# Patient Record
Sex: Female | Born: 1970 | Race: Black or African American | Hispanic: No | Marital: Married | Smoking: Never smoker
Health system: Southern US, Community
[De-identification: ages and names within clinical notes are randomized; demographics above are authoritative.]

## PROBLEM LIST (undated history)

## (undated) DIAGNOSIS — E119 Type 2 diabetes mellitus without complications: Secondary | ICD-10-CM

## (undated) DIAGNOSIS — I10 Essential (primary) hypertension: Secondary | ICD-10-CM

## (undated) DIAGNOSIS — E7800 Pure hypercholesterolemia, unspecified: Secondary | ICD-10-CM

## (undated) HISTORY — PX: TUBAL LIGATION: SHX77

## (undated) HISTORY — DX: Essential (primary) hypertension: I10

## (undated) HISTORY — DX: Type 2 diabetes mellitus without complications: E11.9

## (undated) HISTORY — DX: Pure hypercholesterolemia, unspecified: E78.00

## (undated) HISTORY — PX: GALLBLADDER SURGERY: SHX652

---

## 2007-01-04 HISTORY — PX: REDUCTION MAMMAPLASTY: SUR839

## 2014-08-12 ENCOUNTER — Other Ambulatory Visit: Payer: Self-pay | Admitting: Internal Medicine

## 2014-08-12 DIAGNOSIS — E7800 Pure hypercholesterolemia, unspecified: Secondary | ICD-10-CM | POA: Insufficient documentation

## 2014-08-12 DIAGNOSIS — Z1231 Encounter for screening mammogram for malignant neoplasm of breast: Secondary | ICD-10-CM

## 2014-08-12 DIAGNOSIS — Z6841 Body Mass Index (BMI) 40.0 and over, adult: Secondary | ICD-10-CM | POA: Insufficient documentation

## 2014-08-12 DIAGNOSIS — I10 Essential (primary) hypertension: Secondary | ICD-10-CM | POA: Insufficient documentation

## 2014-08-12 DIAGNOSIS — E119 Type 2 diabetes mellitus without complications: Secondary | ICD-10-CM | POA: Insufficient documentation

## 2014-08-19 ENCOUNTER — Ambulatory Visit: Payer: Self-pay

## 2015-03-13 ENCOUNTER — Ambulatory Visit
Admission: RE | Admit: 2015-03-13 | Discharge: 2015-03-13 | Disposition: A | Discharge status: Home / Self Care | Payer: Managed Care, Other (non HMO) | Source: Ambulatory Visit | Attending: Internal Medicine | Admitting: Internal Medicine

## 2015-03-13 DIAGNOSIS — Z1231 Encounter for screening mammogram for malignant neoplasm of breast: Secondary | ICD-10-CM | POA: Diagnosis not present

## 2015-08-21 DIAGNOSIS — D649 Anemia, unspecified: Secondary | ICD-10-CM | POA: Insufficient documentation

## 2016-04-19 ENCOUNTER — Other Ambulatory Visit: Payer: Self-pay | Admitting: Internal Medicine

## 2016-04-19 DIAGNOSIS — Z1231 Encounter for screening mammogram for malignant neoplasm of breast: Secondary | ICD-10-CM

## 2016-05-10 ENCOUNTER — Ambulatory Visit
Admission: RE | Admit: 2016-05-10 | Discharge: 2016-05-10 | Disposition: A | Discharge status: Home / Self Care | Payer: Managed Care, Other (non HMO) | Source: Ambulatory Visit | Attending: Internal Medicine | Admitting: Internal Medicine

## 2016-05-10 ENCOUNTER — Encounter (HOSPITAL_COMMUNITY): Payer: Self-pay

## 2016-05-10 DIAGNOSIS — Z1231 Encounter for screening mammogram for malignant neoplasm of breast: Secondary | ICD-10-CM | POA: Diagnosis not present

## 2017-02-16 ENCOUNTER — Ambulatory Visit: Payer: Self-pay | Admitting: Advanced Practice Midwife

## 2017-03-03 ENCOUNTER — Ambulatory Visit (INDEPENDENT_AMBULATORY_CARE_PROVIDER_SITE_OTHER): Payer: Managed Care, Other (non HMO) | Admitting: Advanced Practice Midwife

## 2017-03-03 ENCOUNTER — Encounter: Payer: Self-pay | Admitting: Advanced Practice Midwife

## 2017-03-03 VITALS — BP 140/98 | HR 90 | Ht 60.0 in | Wt 240.0 lb

## 2017-03-03 DIAGNOSIS — Z124 Encounter for screening for malignant neoplasm of cervix: Secondary | ICD-10-CM | POA: Diagnosis not present

## 2017-03-03 DIAGNOSIS — Z1211 Encounter for screening for malignant neoplasm of colon: Secondary | ICD-10-CM | POA: Diagnosis not present

## 2017-03-03 DIAGNOSIS — Z01419 Encounter for gynecological examination (general) (routine) without abnormal findings: Secondary | ICD-10-CM

## 2017-03-03 NOTE — Progress Notes (Signed)
Patient ID: Kimberly Branch, female   DOB: 1970-06-22, 47 y.o.   MRN: [id number]    Gynecology Annual Exam  PCP: Barbette Reichmann, MD  Chief Complaint:  Chief Complaint  Patient presents with  . Gynecologic Exam    History of Present Illness: Patient is a 47 y.o. G3P3003 presents for annual exam. The patient has no complaints today. She is having some irregular periods. Her last period was in September of 2018. Prior to that she had slightly irregular periods ranging from 2 weeks to 2 months. They usually last 5 days and are of medium flow. She denies clots or heavy bleeding.   LMP: Patient's last menstrual period was 09/21/2016. Postcoital Bleeding: no Dysmenorrhea: no   The patient is sexually active. She currently uses tubal ligation for contraception. She has mild dyspareunia- mainly dryness. She uses lubrication which helps.  The patient does perform self breast exams.  There is no notable family history of breast or ovarian cancer in her family.  The patient wears seatbelts: yes.   The patient has regular exercise: yes.  She uses the treadmill about 2 days per week.  The patient denies current symptoms of depression.    Review of Systems: Review of Systems  Constitutional: Negative.   HENT: Negative.   Eyes: Negative.   Respiratory: Negative.   Cardiovascular: Negative.   Gastrointestinal: Negative.   Genitourinary: Negative.   Musculoskeletal: Negative.   Skin: Negative.   Neurological: Negative.   Endo/Heme/Allergies: Negative.   Psychiatric/Behavioral: Negative.     Past Medical History:  Past Medical History:  Diagnosis Date  . Diabetes mellitus without complication (HCC)   . Hypercholesterolemia   . Hypertension     Past Surgical History:  Past Surgical History:  Procedure Laterality Date  . GALLBLADDER SURGERY    . REDUCTION MAMMAPLASTY Bilateral 2009  . TUBAL LIGATION      Gynecologic History:  Patient's last menstrual period was  09/21/2016. Contraception: tubal ligation Last Pap: 4 years ago Results were:  no abnormalities  Last mammogram: 2018 Results were: BI-RAD I  Obstetric History: [id number]  Family History:  Family History  Problem Relation Age of Onset  . Hypertension Mother   . Diabetes Father   . Hypertension Father   . Breast cancer Neg Hx     Social History:  Social History   Socioeconomic History  . Marital status: Married    Spouse name: Not on file  . Number of children: Not on file  . Years of education: Not on file  . Highest education level: Not on file  Social Needs  . Financial resource strain: Not on file  . Food insecurity - worry: Not on file  . Food insecurity - inability: Not on file  . Transportation needs - medical: Not on file  . Transportation needs - non-medical: Not on file  Occupational History  . Not on file  Tobacco Use  . Smoking status: Never Smoker  . Smokeless tobacco: Never Used  Substance and Sexual Activity  . Alcohol use: Yes    Frequency: Never  . Drug use: No  . Sexual activity: Yes    Birth control/protection: None, Surgical  Other Topics Concern  . Not on file  Social History Narrative  . Not on file    Allergies:  No Known Allergies  Medications: Prior to Admission medications   Medication Sig Start Date End Date Taking? Authorizing Provider  albuterol (PROVENTIL HFA) 108 (90 Base) MCG/ACT inhaler Inhale into the lungs. 06/08/16  Yes [provider]  atorvastatin (LIPITOR) 10 MG tablet Take by mouth. 01/13/17  Yes [provider]  clonazePAM (KLONOPIN) 1 MG tablet Take by mouth. 01/13/17  Yes [provider]  cyanocobalamin (,VITAMIN B-12,) 1000 MCG/ML injection INJECT 1 ML INTO THE MUSCLE MONTHLY 09/06/16  Yes [provider]  furosemide (LASIX) 20 MG tablet Take by mouth. 01/13/17 01/13/18 Yes [provider]  glipiZIDE (GLUCOTROL) 5 MG tablet Take by mouth. 01/13/17  Yes [provider]   ibuprofen (ADVIL,MOTRIN) 800 MG tablet Take by mouth. 06/08/16  Yes [provider]  metFORMIN (GLUCOPHAGE) 1000 MG tablet Take by mouth. 01/13/17  Yes [provider]  metoprolol succinate (TOPROL-XL) 25 MG 24 hr tablet Take by mouth. 01/13/17 01/13/18 Yes [provider]  VENTOLIN HFA 108 (90 Base) MCG/ACT inhaler  01/01/17   [provider]    Physical Exam Vitals: Blood pressure (!) 140/98, pulse 90, height 5' (1.524 m), weight 240 lb (108.9 kg), last menstrual period 09/21/2016.  General: NAD HEENT: normocephalic, anicteric Thyroid: no enlargement, no palpable nodules Pulmonary: No increased work of breathing, CTAB Cardiovascular: RRR, distal pulses 2+ Breast: Breast symmetrical, no tenderness, no palpable nodules or masses, no skin or nipple retraction present, no nipple discharge.  No axillary or supraclavicular lymphadenopathy. Abdomen: NABS, soft, non-tender, non-distended.  Umbilicus without lesions.  No hepatomegaly, splenomegaly or masses palpable. No evidence of hernia  Genitourinary:  External: Normal external female genitalia.  Normal urethral meatus, normal  Bartholin's and Skene's glands.    Vagina: Normal vaginal mucosa, no evidence of prolapse.    Cervix: Grossly normal in appearance, no bleeding, no CMT  Uterus: Non-enlarged, mobile, normal contour.    Adnexa: ovaries non-enlarged, no adnexal masses  Rectal: deferred  Lymphatic: no evidence of inguinal lymphadenopathy Extremities: no edema, erythema, or tenderness Neurologic: Grossly intact Psychiatric: mood appropriate, affect full   Assessment: 47 y.o. G3P3003 routine annual exam  Plan: Problem List Items Addressed This Visit    None    Visit Diagnoses    Well woman exam with routine gynecological exam    -  Primary   Relevant Orders   IGP, Aptima HPV   Routine cervical smear       Relevant Orders   IGP, Aptima HPV   Screen for colon cancer       Relevant Orders    Ambulatory referral to Gastroenterology      1) Mammogram - recommend yearly screening mammogram.  Mammogram Is up to date   2) STI screening  was offered and declined  3) ASCCP guidelines and rational discussed.  Patient opts for every 3 years screening interval  4) Contraception - the patient is currently using  tubal ligation.   5) Colonoscopy: referral sent today-- Screening recommended starting at age 5 for average risk individuals, age 68 for individuals deemed at increased risk (including African Americans) and recommended to continue until age 81.  For patient age 25-85 individualized approach is recommended.  Gold standard screening is via colonoscopy, Cologuard screening is an acceptable alternative for patient unwilling or unable to undergo colonoscopy.  "Colorectal cancer screening for average?risk adults: 2018 guideline update from the American Cancer Society"[place]: A Cancer Journal for Clinicians: Jun 01, 2016   6) Routine healthcare maintenance including cholesterol, diabetes screening discussed managed by PCP   7) Increase healthy lifestyle diet and exercise  8) Return in 1 year (on 03/04/2018) for annual established gyn.   Tresea Mall, CNM [facility], [facility] 03/03/2017, 8:40 AM

## 2017-03-03 NOTE — Patient Instructions (Signed)
American Heart Association (AHA) Exercise Recommendation  Being physically active is important to prevent heart disease and stroke, the nation's No. 1and No. 5killers. To improve overall cardiovascular health, we suggest at least 150 minutes per week of moderate exercise or 75 minutes per week of vigorous exercise (or a combination of moderate and vigorous activity). Thirty minutes a day, five times a week is an easy goal to remember. You will also experience benefits even if you divide your time into two or three segments of 10 to 15 minutes per day.  For people who would benefit from lowering their blood pressure or cholesterol, we recommend 40 minutes of aerobic exercise of moderate to vigorous intensity three to four times a week to lower the risk for heart attack and stroke.  Physical activity is anything that makes you move your body and burn calories.  This includes things like climbing stairs or playing sports. Aerobic exercises benefit your heart, and include walking, jogging, swimming or biking. Strength and stretching exercises are best for overall stamina and flexibility.  The simplest, positive change you can make to effectively improve your heart health is to start walking. It's enjoyable, free, easy, social and great exercise. A walking program is flexible and boasts high success rates because people can stick with it. It's easy for walking to become a regular and satisfying part of life.   For Overall Cardiovascular Health:  At least 30 minutes of moderate-intensity aerobic activity at least 5 days per week for a total of 150  OR   At least 25 minutes of vigorous aerobic activity at least 3 days per week for a total of 75 minutes; or a combination of moderate- and vigorous-intensity aerobic activity  AND   Moderate- to high-intensity muscle-strengthening activity at least 2 days per week for additional health benefits.  For Lowering Blood Pressure and Cholesterol  An  average 40 minutes of moderate- to vigorous-intensity aerobic activity 3 or 4 times per week  What if I can't make it to the time goal? Something is always better than nothing! And everyone has to start somewhere. Even if you've been sedentary for years, today is the day you can begin to make healthy changes in your life. If you don't think you'll make it for 30 or 40 minutes, set a reachable goal for today. You can work up toward your overall goal by increasing your time as you get stronger. Don't let all-or-nothing thinking rob you of doing what you can every day.  Source:http://www.heart.org   Mediterranean Diet A Mediterranean diet refers to food and lifestyle choices that are based on the traditions of countries located on the [employer]. This way of eating has been shown to help prevent certain conditions and improve outcomes for people who have chronic diseases, like kidney disease and heart disease. What are tips for following this plan? Lifestyle  Cook and eat meals together with your family, when possible.  Drink enough fluid to keep your urine clear or pale yellow.  Be physically active every day. This includes: ? Aerobic exercise like running or swimming. ? Leisure activities like gardening, walking, or housework.  Get 7-8 hours of sleep each night.  If recommended by your health care provider, drink red wine in moderation. This means 1 glass a day for nonpregnant women and 2 glasses a day for men. A glass of wine equals 5 oz (150 mL). Reading food labels  Check the serving size of packaged foods. For foods such as rice  and pasta, the serving size refers to the amount of cooked product, not dry.  Check the total fat in packaged foods. Avoid foods that have saturated fat or trans fats.  Check the ingredients list for added sugars, such as corn syrup. Shopping  At the grocery store, buy most of your food from the areas near the walls of the store. This  includes: ? Fresh fruits and vegetables (produce). ? Grains, beans, nuts, and seeds. Some of these may be available in unpackaged forms or large amounts (in bulk). ? Fresh seafood. ? Poultry and eggs. ? Low-fat dairy products.  Buy whole ingredients instead of prepackaged foods.  Buy fresh fruits and vegetables in-season from local farmers markets.  Buy frozen fruits and vegetables in resealable bags.  If you do not have access to quality fresh seafood, buy precooked frozen shrimp or canned fish, such as tuna, salmon, or sardines.  Buy small amounts of raw or cooked vegetables, salads, or olives from the deli or salad bar at your store.  Stock your pantry so you always have certain foods on hand, such as olive oil, canned tuna, canned tomatoes, rice, pasta, and beans. Cooking  Cook foods with extra-virgin olive oil instead of using butter or other vegetable oils.  Have meat as a side dish, and have vegetables or grains as your main dish. This means having meat in small portions or adding small amounts of meat to foods like pasta or stew.  Use beans or vegetables instead of meat in common dishes like chili or lasagna.  Experiment with different cooking methods. Try roasting or broiling vegetables instead of steaming or sauteing them.  Add frozen vegetables to soups, stews, pasta, or rice.  Add nuts or seeds for added healthy fat at each meal. You can add these to yogurt, salads, or vegetable dishes.  Marinate fish or vegetables using olive oil, lemon juice, garlic, and fresh herbs. Meal planning  Plan to eat 1 vegetarian meal one day each week. Try to work up to 2 vegetarian meals, if possible.  Eat seafood 2 or more times a week.  Have healthy snacks readily available, such as: ? Vegetable sticks with hummus. ? [place] yogurt. ? Fruit and nut trail mix.  Eat balanced meals throughout the week. This includes: ? Fruit: 2-3 servings a day ? Vegetables: 4-5 servings a  day ? Low-fat dairy: 2 servings a day ? Fish, poultry, or lean meat: 1 serving a day ? Beans and legumes: 2 or more servings a week ? Nuts and seeds: 1-2 servings a day ? Whole grains: 6-8 servings a day ? Extra-virgin olive oil: 3-4 servings a day  Limit red meat and sweets to only a few servings a month What are my food choices?  Mediterranean diet ? Recommended ? Grains: Whole-grain pasta. Brown rice. Bulgar wheat. Polenta. Couscous. Whole-wheat bread. [place]. ? Vegetables: Artichokes. Beets. Broccoli. Cabbage. Carrots. Eggplant. Green beans. Chard. Kale. Spinach. Onions. Leeks. Peas. Squash. Tomatoes. Peppers. Radishes. ? Fruits: Apples. Apricots. Avocado. Berries. Bananas. Cherries. Dates. Figs. Grapes. Lemons. Melon. Oranges. Peaches. Plums. Pomegranate. ? Meats and other protein foods: Beans. Almonds. Sunflower seeds. Pine nuts. Peanuts. [place]. Salmon. Scallops. Shrimp. [place]. Tilapia. Clams. Oysters. Eggs. ? Dairy: Low-fat milk. Cheese. Greek yogurt. ? Beverages: Water. Red wine. Herbal tea. ? Fats and oils: Extra virgin olive oil. Avocado oil. Grape seed oil. ? Sweets and desserts: [place] yogurt with honey. Baked apples. Poached pears. Trail mix. ? Seasoning and other foods: Basil. Cilantro. Coriander.  Cumin. Mint. Parsley. Sage. Rosemary. Tarragon. Garlic. Oregano. Thyme. Pepper. Balsalmic vinegar. Tahini. Hummus. Tomato sauce. Olives. Mushrooms. ? Limit these ? Grains: Prepackaged pasta or rice dishes. Prepackaged cereal with added sugar. ? Vegetables: Deep fried potatoes (french fries). ? Fruits: Fruit canned in syrup. ? Meats and other protein foods: Beef. Pork. Lamb. Poultry with skin. Hot dogs. Bacon. ? Dairy: Ice cream. Sour cream. Whole milk. ? Beverages: Juice. Sugar-sweetened soft drinks. Beer. Liquor and spirits. ? Fats and oils: Butter. Canola oil. Vegetable oil. Beef fat (tallow). Lard. ? Sweets and desserts: Cookies. Cakes. Pies. Candy. ? Seasoning and other  foods: Mayonnaise. Premade sauces and marinades. ? The items listed may not be a complete list. Talk with your dietitian about what dietary choices are right for you. Summary  The Mediterranean diet includes both food and lifestyle choices.  Eat a variety of fresh fruits and vegetables, beans, nuts, seeds, and whole grains.  Limit the amount of red meat and sweets that you eat.  Talk with your health care provider about whether it is safe for you to drink red wine in moderation. This means 1 glass a day for nonpregnant women and 2 glasses a day for men. A glass of wine equals 5 oz (150 mL). This information is not intended to replace advice given to you by your health care provider. Make sure you discuss any questions you have with your health care provider. Document Released: 08/13/2015 Document Revised: 09/15/2015 Document Reviewed: 08/13/2015 Elsevier Interactive Patient Education  2018 [employer] Health Maintenance for Postmenopausal Women Menopause is a normal process in which your reproductive ability comes to an end. This process happens gradually over a span of months to years, usually between the ages of 48 and 55. Menopause is complete when you have missed 12 consecutive menstrual periods. It is important to talk with your health care provider about some of the most common conditions that affect postmenopausal women, such as heart disease, cancer, and bone loss (osteoporosis). Adopting a healthy lifestyle and getting preventive care can help to promote your health and wellness. Those actions can also lower your chances of developing some of these common conditions. What should I know about menopause? During menopause, you may experience a number of symptoms, such as:  Moderate-to-severe hot flashes.  Night sweats.  Decrease in sex drive.  Mood swings.  Headaches.  Tiredness.  Irritability.  Memory problems.  Insomnia.  Choosing to treat or not to treat menopausal  changes is an individual decision that you make with your health care provider. What should I know about hormone replacement therapy and supplements? Hormone therapy products are effective for treating symptoms that are associated with menopause, such as hot flashes and night sweats. Hormone replacement carries certain risks, especially as you become older. If you are thinking about using estrogen or estrogen with progestin treatments, discuss the benefits and risks with your health care provider. What should I know about heart disease and stroke? Heart disease, heart attack, and stroke become more likely as you age. This may be due, in part, to the hormonal changes that your body experiences during menopause. These can affect how your body processes dietary fats, triglycerides, and cholesterol. Heart attack and stroke are both medical emergencies. There are many things that you can do to help prevent heart disease and stroke:  Have your blood pressure checked at least every 1-2 years. High blood pressure causes heart disease and increases the risk of stroke.  If you are 55-79 years   old, ask your health care provider if you should take aspirin to prevent a heart attack or a stroke.  Do not use any tobacco products, including cigarettes, chewing tobacco, or electronic cigarettes. If you need help quitting, ask your health care provider.  It is important to eat a healthy diet and maintain a healthy weight. ? Be sure to include plenty of vegetables, fruits, low-fat dairy products, and lean protein. ? Avoid eating foods that are high in solid fats, added sugars, or salt (sodium).  Get regular exercise. This is one of the most important things that you can do for your health. ? Try to exercise for at least 150 minutes each week. The type of exercise that you do should increase your heart rate and make you sweat. This is known as moderate-intensity exercise. ? Try to do strengthening exercises at least  twice each week. Do these in addition to the moderate-intensity exercise.  Know your numbers.Ask your health care provider to check your cholesterol and your blood glucose. Continue to have your blood tested as directed by your health care provider.  What should I know about cancer screening? There are several types of cancer. Take the following steps to reduce your risk and to catch any cancer development as early as possible. Breast Cancer  Practice breast self-awareness. ? This means understanding how your breasts normally appear and feel. ? It also means doing regular breast self-exams. Let your health care provider know about any changes, no matter how small.  If you are 35 or older, have a clinician do a breast exam (clinical breast exam or CBE) every year. Depending on your age, family history, and medical history, it may be recommended that you also have a yearly breast X-ray (mammogram).  If you have a family history of breast cancer, talk with your health care provider about genetic screening.  If you are at high risk for breast cancer, talk with your health care provider about having an MRI and a mammogram every year.  Breast cancer (BRCA) gene test is recommended for women who have family members with BRCA-related cancers. Results of the assessment will determine the need for genetic counseling and BRCA1 and for BRCA2 testing. BRCA-related cancers include these types: ? Breast. This occurs in males or females. ? Ovarian. ? Tubal. This may also be called fallopian tube cancer. ? Cancer of the abdominal or pelvic lining (peritoneal cancer). ? Prostate. ? Pancreatic.  Cervical, Uterine, and Ovarian Cancer Your health care provider may recommend that you be screened regularly for cancer of the pelvic organs. These include your ovaries, uterus, and vagina. This screening involves a pelvic exam, which includes checking for microscopic changes to the surface of your cervix (Pap  test).  For women ages 21-65, health care providers may recommend a pelvic exam and a Pap test every three years. For women ages 69-65, they may recommend the Pap test and pelvic exam, combined with testing for human papilloma virus (HPV), every five years. Some types of HPV increase your risk of cervical cancer. Testing for HPV may also be done on women of any age who have unclear Pap test results.  Other health care providers may not recommend any screening for nonpregnant women who are considered low risk for pelvic cancer and have no symptoms. Ask your health care provider if a screening pelvic exam is right for you.  If you have had past treatment for cervical cancer or a condition that could lead to cancer, you need  Pap tests and screening for cancer for at least 20 years after your treatment. If Pap tests have been discontinued for you, your risk factors (such as having a new sexual partner) need to be reassessed to determine if you should start having screenings again. Some women have medical problems that increase the chance of getting cervical cancer. In these cases, your health care provider may recommend that you have screening and Pap tests more often.  If you have a family history of uterine cancer or ovarian cancer, talk with your health care provider about genetic screening.  If you have vaginal bleeding after reaching menopause, tell your health care provider.  There are currently no reliable tests available to screen for ovarian cancer.  Lung Cancer Lung cancer screening is recommended for adults 55-80 years old who are at high risk for lung cancer because of a history of smoking. A yearly low-dose CT scan of the lungs is recommended if you:  Currently smoke.  Have a history of at least 30 pack-years of smoking and you currently smoke or have quit within the past 15 years. A pack-year is smoking an average of one pack of cigarettes per day for one year.  Yearly screening  should:  Continue until it has been 15 years since you quit.  Stop if you develop a health problem that would prevent you from having lung cancer treatment.  Colorectal Cancer  This type of cancer can be detected and can often be prevented.  Routine colorectal cancer screening usually begins at age 50 and continues through age 75.  If you have risk factors for colon cancer, your health care provider may recommend that you be screened at an earlier age.  If you have a family history of colorectal cancer, talk with your health care provider about genetic screening.  Your health care provider may also recommend using home test kits to check for hidden blood in your stool.  A small camera at the end of a tube can be used to examine your colon directly (sigmoidoscopy or colonoscopy). This is done to check for the earliest forms of colorectal cancer.  Direct examination of the colon should be repeated every 5-10 years until age 75. However, if early forms of precancerous polyps or small growths are found or if you have a family history or genetic risk for colorectal cancer, you may need to be screened more often.  Skin Cancer  Check your skin from head to toe regularly.  Monitor any moles. Be sure to tell your health care provider: ? About any new moles or changes in moles, especially if there is a change in a mole's shape or color. ? If you have a mole that is larger than the size of a pencil eraser.  If any of your family members has a history of skin cancer, especially at a young age, talk with your health care provider about genetic screening.  Always use sunscreen. Apply sunscreen liberally and repeatedly throughout the day.  Whenever you are outside, protect yourself by wearing long sleeves, pants, a wide-brimmed hat, and sunglasses.  What should I know about osteoporosis? Osteoporosis is a condition in which bone destruction happens more quickly than new bone creation. After  menopause, you may be at an increased risk for osteoporosis. To help prevent osteoporosis or the bone fractures that can happen because of osteoporosis, the following is recommended:  If you are 19-50 years old, get at least 1,000 mg of calcium and at least 600   mg of vitamin D per day.  If you are older than age 16 but younger than age 47, get at least 1,200 mg of calcium and at least 600 mg of vitamin D per day.  If you are older than age 31, get at least 1,200 mg of calcium and at least 800 mg of vitamin D per day.  Smoking and excessive alcohol intake increase the risk of osteoporosis. Eat foods that are rich in calcium and vitamin D, and do weight-bearing exercises several times each week as directed by your health care provider. What should I know about how menopause affects my mental health? Depression may occur at any age, but it is more common as you become older. Common symptoms of depression include:  Low or sad mood.  Changes in sleep patterns.  Changes in appetite or eating patterns.  Feeling an overall lack of motivation or enjoyment of activities that you previously enjoyed.  Frequent crying spells.  Talk with your health care provider if you think that you are experiencing depression. What should I know about immunizations? It is important that you get and maintain your immunizations. These include:  Tetanus, diphtheria, and pertussis (Tdap) booster vaccine.  Influenza every year before the flu season begins.  Pneumonia vaccine.  Shingles vaccine.  Your health care provider may also recommend other immunizations. This information is not intended to replace advice given to you by your health care provider. Make sure you discuss any questions you have with your health care provider. Document Released: 02/11/2005 Document Revised: 07/10/2015 Document Reviewed: 09/23/2014 Elsevier Interactive Patient Education  2018 [employer].

## 2017-03-07 LAB — IGP, APTIMA HPV
HPV APTIMA: NEGATIVE
PAP Smear Comment: 0

## 2017-04-13 ENCOUNTER — Encounter: Payer: Self-pay | Admitting: *Deleted

## 2017-07-04 ENCOUNTER — Other Ambulatory Visit: Payer: Self-pay | Admitting: Internal Medicine

## 2017-07-04 DIAGNOSIS — Z1231 Encounter for screening mammogram for malignant neoplasm of breast: Secondary | ICD-10-CM

## 2017-07-21 ENCOUNTER — Ambulatory Visit
Admission: RE | Admit: 2017-07-21 | Discharge: 2017-07-21 | Disposition: A | Discharge status: Home / Self Care | Payer: Managed Care, Other (non HMO) | Source: Ambulatory Visit | Attending: Internal Medicine | Admitting: Internal Medicine

## 2017-07-21 DIAGNOSIS — Z1231 Encounter for screening mammogram for malignant neoplasm of breast: Secondary | ICD-10-CM | POA: Diagnosis present

## 2017-07-24 ENCOUNTER — Other Ambulatory Visit: Payer: Self-pay | Admitting: Internal Medicine

## 2017-07-24 DIAGNOSIS — N6320 Unspecified lump in the left breast, unspecified quadrant: Secondary | ICD-10-CM

## 2017-07-24 DIAGNOSIS — R928 Other abnormal and inconclusive findings on diagnostic imaging of breast: Secondary | ICD-10-CM

## 2017-07-24 DIAGNOSIS — N6489 Other specified disorders of breast: Secondary | ICD-10-CM

## 2017-08-02 ENCOUNTER — Ambulatory Visit
Admission: RE | Admit: 2017-08-02 | Discharge: 2017-08-02 | Disposition: A | Discharge status: Home / Self Care | Payer: Managed Care, Other (non HMO) | Source: Ambulatory Visit | Attending: Internal Medicine | Admitting: Internal Medicine

## 2017-08-02 DIAGNOSIS — N6489 Other specified disorders of breast: Secondary | ICD-10-CM | POA: Insufficient documentation

## 2017-08-02 DIAGNOSIS — R928 Other abnormal and inconclusive findings on diagnostic imaging of breast: Secondary | ICD-10-CM | POA: Diagnosis not present

## 2017-08-02 DIAGNOSIS — N6320 Unspecified lump in the left breast, unspecified quadrant: Secondary | ICD-10-CM | POA: Diagnosis present

## 2017-08-04 ENCOUNTER — Other Ambulatory Visit: Payer: Self-pay | Admitting: Internal Medicine

## 2017-08-04 DIAGNOSIS — N6320 Unspecified lump in the left breast, unspecified quadrant: Secondary | ICD-10-CM

## 2017-11-02 ENCOUNTER — Other Ambulatory Visit: Payer: Managed Care, Other (non HMO)

## 2019-12-01 IMAGING — US US BREAST*L* LIMITED INC AXILLA
1 series · 5 of 5 positions shown · non-contrast
Comparison: Previous exam(s).

CLINICAL DATA: The patient was called back for left breast
asymmetry and a mass in the medial superior left breast.

EXAM:
DIGITAL DIAGNOSTIC LEFT MAMMOGRAM WITH CAD AND TOMO
ULTRASOUND LEFT BREAST

[Series 1: us breast*left* limited inc axilla · 0.06mm/px · 5 of 5 slices shown]
[im 1/5]
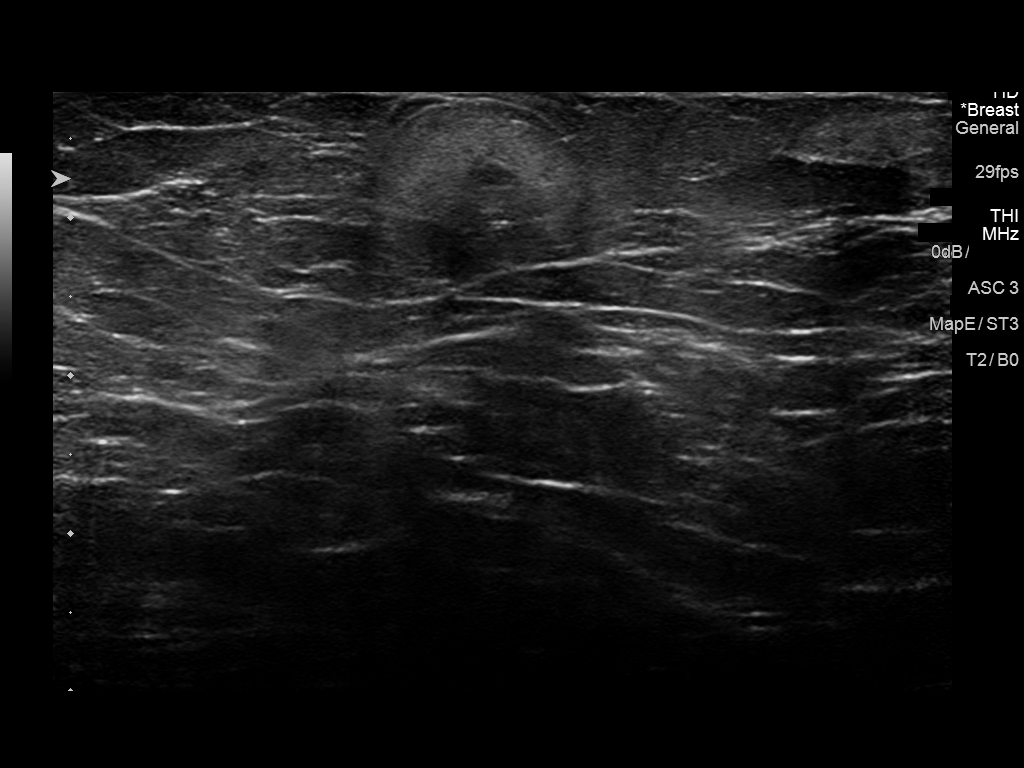
[im 2/5]
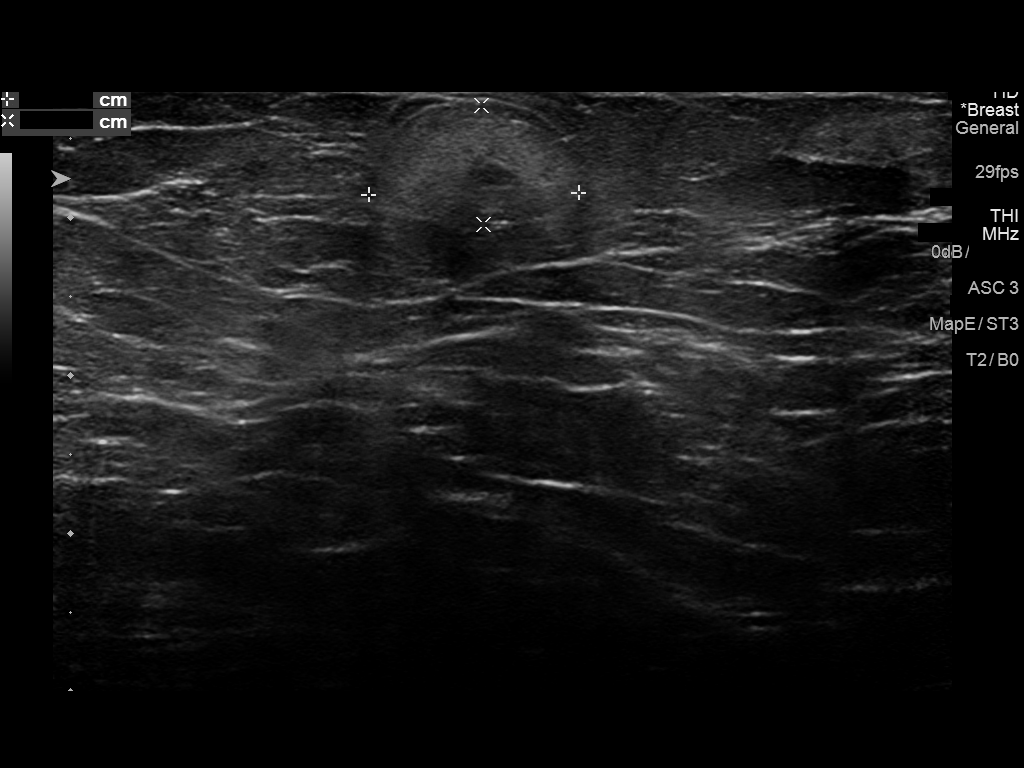
[im 3/5]
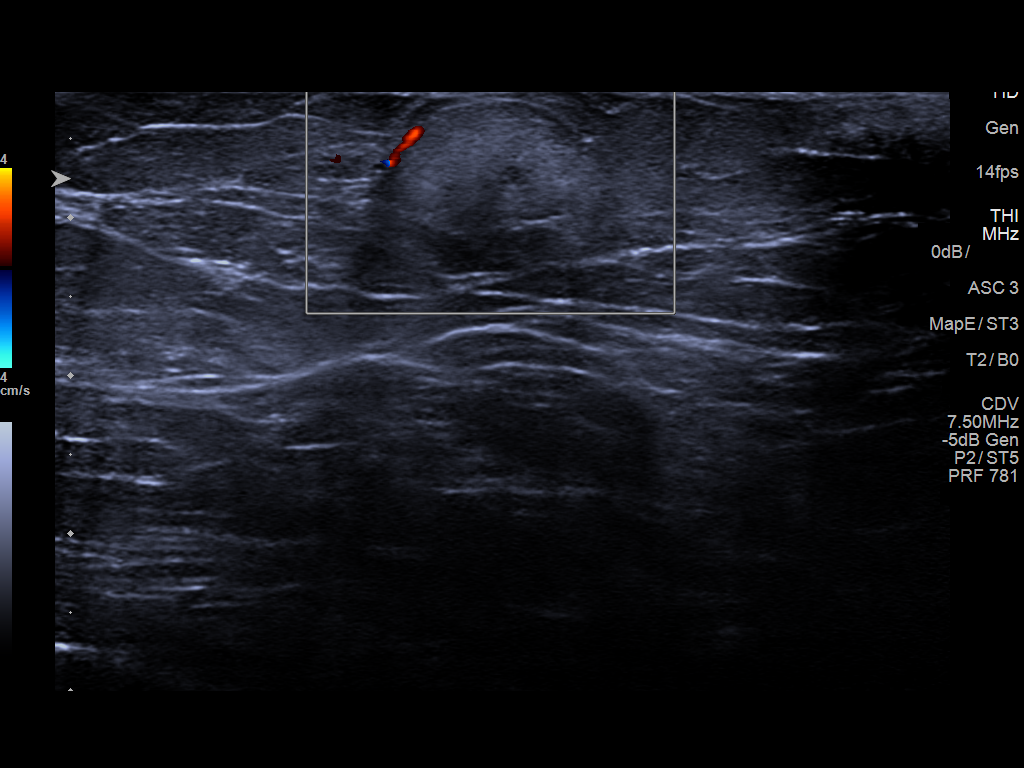
[im 4/5]
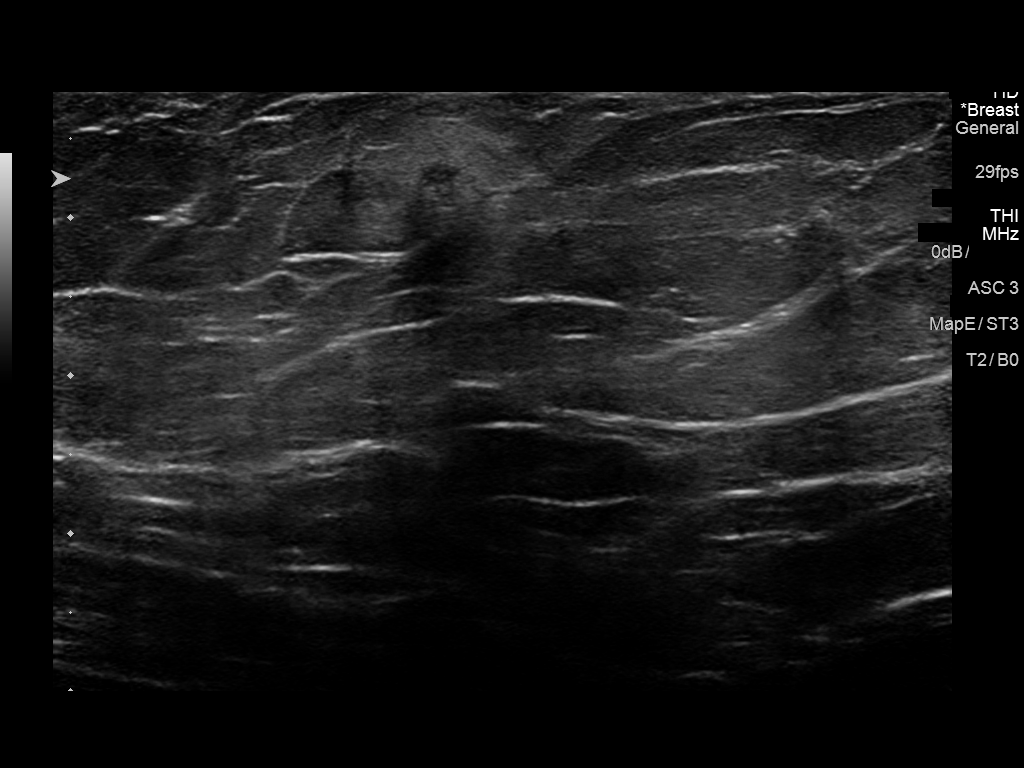
[im 5/5]
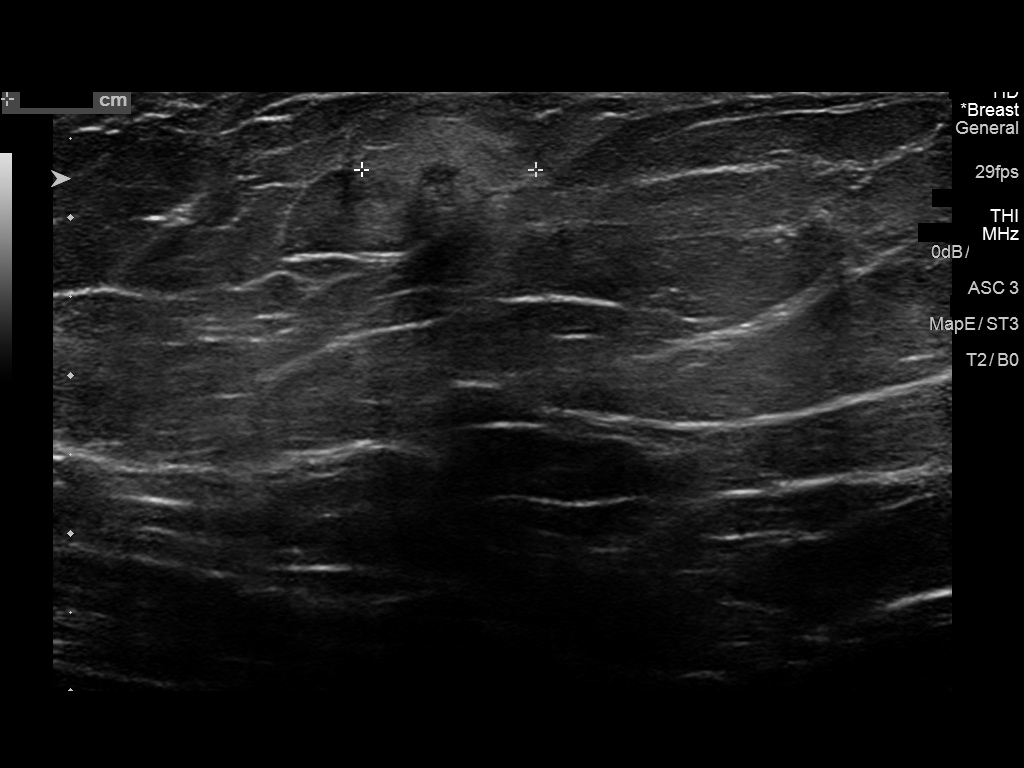

[5 of 5 positions shown; findings below may reference images not displayed]

ACR Breast Density Category b: There are scattered areas of
fibroglandular density.
FINDINGS: The asymmetry takes on the appearance of glandular tissue on
additional imaging.

The mass persists on additional imaging. There is a small amount of
fat identified within the mass.

Mammographic images were processed with CAD.

On physical exam, no suspicious lumps identified.

Targeted ultrasound is performed, showing a primarily hyperechoic
mass with a small amount of internal decreased echogenicity at 10
o'clock, 4 cm from the nipple measuring 13 x 8 by 11 mm, thought to
correlate with the mammographic finding.
IMPRESSION: The sonographic and mammographic findings are most suggestive of fat
necrosis. The patient had a breast reduction in 3771. She had a fall
within the last year with injury to the left breast.

RECOMMENDATION:
Recommend a three-month follow-up mammogram and ultrasound to ensure
stability or appropriate evolution of the suspected fat necrosis in
the left breast.

I have discussed the findings and recommendations with the patient.
Results were also provided in writing at the conclusion of the
visit. If applicable, a reminder letter will be sent to the patient
regarding the next appointment.

BI-RADS CATEGORY  3: Probably benign.
